# Patient Record
Sex: Female | Born: 1945
Health system: Southern US, Community
[De-identification: ages and names within clinical notes are randomized; demographics above are authoritative.]

---

## 2019-01-26 ENCOUNTER — Inpatient Hospital Stay
Admission: RE | Admit: 2019-01-26 | Discharge: 2019-02-16 | Disposition: A | Discharge status: Hospice | Payer: Medicare Other | Source: Other Acute Inpatient Hospital | Attending: Internal Medicine | Admitting: Internal Medicine

## 2019-01-26 DIAGNOSIS — Z95828 Presence of other vascular implants and grafts: Secondary | ICD-10-CM

## 2019-01-26 DIAGNOSIS — Z931 Gastrostomy status: Secondary | ICD-10-CM

## 2019-01-26 DIAGNOSIS — D72829 Elevated white blood cell count, unspecified: Secondary | ICD-10-CM

## 2019-01-26 DIAGNOSIS — R079 Chest pain, unspecified: Secondary | ICD-10-CM

## 2019-01-27 ENCOUNTER — Other Ambulatory Visit (HOSPITAL_COMMUNITY): Payer: Medicare Other

## 2019-01-27 LAB — CBC WITH DIFFERENTIAL/PLATELET
Abs Immature Granulocytes: 0.66 10*3/uL — ABNORMAL HIGH (ref 0.00–0.07)
Basophils Absolute: 0.1 10*3/uL (ref 0.0–0.1)
Basophils Relative: 1 %
Eosinophils Absolute: 0.3 10*3/uL (ref 0.0–0.5)
Eosinophils Relative: 2 %
HCT: 29.6 % — ABNORMAL LOW (ref 36.0–46.0)
Hemoglobin: 9.5 g/dL — ABNORMAL LOW (ref 12.0–15.0)
Immature Granulocytes: 6 %
Lymphocytes Relative: 6 %
Lymphs Abs: 0.7 10*3/uL (ref 0.7–4.0)
MCH: 32.5 pg (ref 26.0–34.0)
MCHC: 32.1 g/dL (ref 30.0–36.0)
MCV: 101.4 fL — ABNORMAL HIGH (ref 80.0–100.0)
Monocytes Absolute: 1 10*3/uL (ref 0.1–1.0)
Monocytes Relative: 8 %
Neutro Abs: 9.4 10*3/uL — ABNORMAL HIGH (ref 1.7–7.7)
Neutrophils Relative %: 77 %
Platelets: 307 10*3/uL (ref 150–400)
RBC: 2.92 MIL/uL — ABNORMAL LOW (ref 3.87–5.11)
RDW: 20.5 % — ABNORMAL HIGH (ref 11.5–15.5)
WBC: 12.1 10*3/uL — ABNORMAL HIGH (ref 4.0–10.5)
nRBC: 0.6 % — ABNORMAL HIGH (ref 0.0–0.2)

## 2019-01-27 LAB — COMPREHENSIVE METABOLIC PANEL
ALT: 36 U/L (ref 0–44)
AST: 20 U/L (ref 15–41)
Albumin: 1.7 g/dL — ABNORMAL LOW (ref 3.5–5.0)
Alkaline Phosphatase: 102 U/L (ref 38–126)
Anion gap: 12 (ref 5–15)
BUN: 85 mg/dL — ABNORMAL HIGH (ref 8–23)
CO2: 19 mmol/L — ABNORMAL LOW (ref 22–32)
Calcium: 8.9 mg/dL (ref 8.9–10.3)
Chloride: 101 mmol/L (ref 98–111)
Creatinine, Ser: 4.8 mg/dL — ABNORMAL HIGH (ref 0.44–1.00)
GFR calc Af Amer: 10 mL/min — ABNORMAL LOW (ref >60)
GFR calc non Af Amer: 8 mL/min — ABNORMAL LOW (ref >60)
Glucose, Bld: 122 mg/dL — ABNORMAL HIGH (ref 70–99)
Potassium: 4.4 mmol/L (ref 3.5–5.1)
Sodium: 132 mmol/L — ABNORMAL LOW (ref 135–145)
Total Bilirubin: 1.1 mg/dL (ref 0.3–1.2)
Total Protein: 5.3 g/dL — ABNORMAL LOW (ref 6.5–8.1)

## 2019-01-27 LAB — MAGNESIUM: Magnesium: 2.4 mg/dL (ref 1.7–2.4)

## 2019-01-27 LAB — PROTIME-INR
INR: 1.4 — ABNORMAL HIGH (ref 0.8–1.2)
Prothrombin Time: 16.5 seconds — ABNORMAL HIGH (ref 11.4–15.2)

## 2019-01-27 LAB — PHOSPHORUS: Phosphorus: 4.6 mg/dL (ref 2.5–4.6)

## 2019-01-27 LAB — HEMOGLOBIN A1C
Hgb A1c MFr Bld: 4.8 % (ref 4.8–5.6)
Mean Plasma Glucose: 91.06 mg/dL

## 2019-01-28 LAB — COMPREHENSIVE METABOLIC PANEL
ALT: 24 U/L (ref 0–44)
AST: 17 U/L (ref 15–41)
Albumin: 1.8 g/dL — ABNORMAL LOW (ref 3.5–5.0)
Alkaline Phosphatase: 92 U/L (ref 38–126)
Anion gap: 15 (ref 5–15)
BUN: 108 mg/dL — ABNORMAL HIGH (ref 8–23)
CO2: 15 mmol/L — ABNORMAL LOW (ref 22–32)
Calcium: 8.5 mg/dL — ABNORMAL LOW (ref 8.9–10.3)
Chloride: 100 mmol/L (ref 98–111)
Creatinine, Ser: 6.32 mg/dL — ABNORMAL HIGH (ref 0.44–1.00)
GFR calc Af Amer: 7 mL/min — ABNORMAL LOW (ref >60)
GFR calc non Af Amer: 6 mL/min — ABNORMAL LOW (ref >60)
Glucose, Bld: 172 mg/dL — ABNORMAL HIGH (ref 70–99)
Potassium: 4.7 mmol/L (ref 3.5–5.1)
Sodium: 130 mmol/L — ABNORMAL LOW (ref 135–145)
Total Bilirubin: 0.7 mg/dL (ref 0.3–1.2)
Total Protein: 5.3 g/dL — ABNORMAL LOW (ref 6.5–8.1)

## 2019-01-28 LAB — TRIGLYCERIDES: Triglycerides: 71 mg/dL (ref <150)

## 2019-01-28 LAB — PHOSPHORUS: Phosphorus: 6.4 mg/dL — ABNORMAL HIGH (ref 2.5–4.6)

## 2019-01-28 LAB — MAGNESIUM: Magnesium: 2.5 mg/dL — ABNORMAL HIGH (ref 1.7–2.4)

## 2019-01-29 LAB — CBC
HCT: 26.8 % — ABNORMAL LOW (ref 36.0–46.0)
Hemoglobin: 8.8 g/dL — ABNORMAL LOW (ref 12.0–15.0)
MCH: 33.1 pg (ref 26.0–34.0)
MCHC: 32.8 g/dL (ref 30.0–36.0)
MCV: 100.8 fL — ABNORMAL HIGH (ref 80.0–100.0)
Platelets: 315 10*3/uL (ref 150–400)
RBC: 2.66 MIL/uL — ABNORMAL LOW (ref 3.87–5.11)
RDW: 19 % — ABNORMAL HIGH (ref 11.5–15.5)
WBC: 13.2 10*3/uL — ABNORMAL HIGH (ref 4.0–10.5)
nRBC: 0.4 % — ABNORMAL HIGH (ref 0.0–0.2)

## 2019-01-29 LAB — RENAL FUNCTION PANEL
Albumin: 1.7 g/dL — ABNORMAL LOW (ref 3.5–5.0)
Anion gap: 14 (ref 5–15)
BUN: 116 mg/dL — ABNORMAL HIGH (ref 8–23)
CO2: 15 mmol/L — ABNORMAL LOW (ref 22–32)
Calcium: 8.3 mg/dL — ABNORMAL LOW (ref 8.9–10.3)
Chloride: 102 mmol/L (ref 98–111)
Creatinine, Ser: 6.61 mg/dL — ABNORMAL HIGH (ref 0.44–1.00)
GFR calc Af Amer: 7 mL/min — ABNORMAL LOW (ref >60)
GFR calc non Af Amer: 6 mL/min — ABNORMAL LOW (ref >60)
Glucose, Bld: 131 mg/dL — ABNORMAL HIGH (ref 70–99)
Phosphorus: 7 mg/dL — ABNORMAL HIGH (ref 2.5–4.6)
Potassium: 4.7 mmol/L (ref 3.5–5.1)
Sodium: 131 mmol/L — ABNORMAL LOW (ref 135–145)

## 2019-01-29 LAB — MAGNESIUM: Magnesium: 2.5 mg/dL — ABNORMAL HIGH (ref 1.7–2.4)

## 2019-01-29 LAB — LACTIC ACID, PLASMA: Lactic Acid, Venous: 0.7 mmol/L (ref 0.5–1.9)

## 2019-01-29 NOTE — Consult Note (Signed)
[facility] CONSULT NOTE    Date: 01/29/2019                  Patient Name:  Courtney Watts  MRN: [id number]  DOB: Aug 22, 1946  Age / Sex: 73 y.o., female         PCP: No primary care provider on file.                 Service Requesting Consult:  Hospitalist                 Reason for Consult:  Management of end-stage renal disease            History of Present Illness: Patient is a 73 y.o. 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  She was originally admitted to the outside hospital with abdominal pain, distention, nausea, and vomiting.  She was found to have small bowel obstruction.  Surgery was consulted and patient underwent right hemicolectomy with extensive lysis of adhesions and abdominal washout.  Subsequently she had progressive ischemic bowel with necrosis of the right colon and cecum therefore she underwent total small bowel resection and partial transverse colon with low Hartmann pouch and gastrostomy tube diversion.  She was on TPN for significant period of time.  Surgery went on to recommend PEG tube to gravity drainage with lifelong TPN.  Patient also has end-stage renal disease and was maintained on dialysis on Monday, Wednesday, Friday.  We will plan for dialysis session tomorrow.   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  No alcohol or illicit drug use.  Review of Systems: Patient unable to provide this information as she is unable to concentrate on questions posed.  Vital Signs: Temperature 97.3 pulse 68 respirations 13 blood pressure 188/79 Weight trends: There were no vitals filed for this visit.  Physical Exam: General:  Chronically ill-appearing  Head: Normocephalic, atraumatic.  Eyes: Anicteric, EOMI  Nose: Mucous membranes moist, not inflammed, nonerythematous.  Throat: Oropharynx nonerythematous, no exudate appreciated.   Neck: Supple, trachea midline.  Lungs:  Normal respiratory effort. Clear to auscultation BL without crackles or wheezes.  Heart: Irregular  Abdomen:  Surgical wound noted, BS present, G tube in place  Extremities: No pretibial edema.  Neurologic: Lethargic but arousable  Skin: Abdominal surgical scar noted.    Lab results: Basic Metabolic Panel: Recent Labs  Lab 01/27/19 0726 01/28/19 1522 01/29/19 0540  NA 132* 130* 131*  K 4.4 4.7 4.7  CL 101 100 102  CO2 19* 15* 15*  GLUCOSE  122* 172* 131*  BUN 85* 108* 116*  CREATININE 4.80* 6.32* 6.61*  CALCIUM 8.9 8.5* 8.3*  MG 2.4 2.5* 2.5*  PHOS 4.6 6.4* 7.0*    Liver Function Tests: Recent Labs  Lab 01/27/19 0726 01/28/19 1522 01/29/19 0540  AST 20 17  --   ALT 36 24  --   ALKPHOS 102 92  --   BILITOT 1.1 0.7  --   PROT 5.3* 5.3*  --   ALBUMIN 1.7* 1.8* 1.7*   No results  for input(s): LIPASE, AMYLASE in the last 168 hours. No results for input(s): AMMONIA in the last 168 hours.  CBC: Recent Labs  Lab 01/27/19 0726 01/29/19 0540  WBC 12.1* 13.2*  NEUTROABS 9.4*  --   HGB 9.5* 8.8*  HCT 29.6* 26.8*  MCV 101.4* 100.8*  PLT 307 315    Cardiac Enzymes: No results for input(s): CKTOTAL, CKMB, CKMBINDEX, TROPONINI in the last 168 hours.  BNP: Invalid input(s): POCBNP  CBG: No results for input(s): GLUCAP in the last 168 hours.  Microbiology: No results found for this or any previous visit.  Coagulation Studies: Recent Labs    01/27/19 0726  LABPROT 16.5*  INR 1.4*    Urinalysis: No results for input(s): COLORURINE, LABSPEC, PHURINE, GLUCOSEU, HGBUR, BILIRUBINUR, KETONESUR, PROTEINUR, UROBILINOGEN, NITRITE, LEUKOCYTESUR in the last 72 hours.  Invalid input(s): APPERANCEUR    Imaging:  No results found.   Assessment & Plan: Pt is a 73 y.o. 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  She was originally admitted to the outside hospital with abdominal pain, distention, nausea, and vomiting and found to have small bowel obstruction.  Surgery was consulted and patient underwent right hemicolectomy with extensive lysis of adhesions and abdominal washout initially, however ultimatley had to have total small bowel resection and partial transverse colon with low Hartmann pouch and gastrostomy tube diversion.   1.  ESRD on HD MWF.  Patient due for hemodialysis tomorrow.  We will prepare orders.  2.  Anemia of chronic kidney disease.  Hemoglobin noted to be 8.8.  Start the patient on Retacrit 10,000 units IV with dialysis.  3.  Secondary  hyperparathyroidism.  Evaluate PTH, phosphorus, and calcium tomorrow.  4.  Metabolic acidosis.  Serum bicarbonate 15.  This should be corrected with dialysis treatment.  5.  Thanks for consultation.

## 2019-01-30 LAB — RENAL FUNCTION PANEL
Albumin: 1.7 g/dL — ABNORMAL LOW (ref 3.5–5.0)
Anion gap: 15 (ref 5–15)
BUN: 135 mg/dL — ABNORMAL HIGH (ref 8–23)
CO2: 14 mmol/L — ABNORMAL LOW (ref 22–32)
Calcium: 7.9 mg/dL — ABNORMAL LOW (ref 8.9–10.3)
Chloride: 102 mmol/L (ref 98–111)
Creatinine, Ser: 7.54 mg/dL — ABNORMAL HIGH (ref 0.44–1.00)
GFR calc Af Amer: 6 mL/min — ABNORMAL LOW (ref >60)
GFR calc non Af Amer: 5 mL/min — ABNORMAL LOW (ref >60)
Glucose, Bld: 89 mg/dL (ref 70–99)
Phosphorus: 8.4 mg/dL — ABNORMAL HIGH (ref 2.5–4.6)
Potassium: 5.5 mmol/L — ABNORMAL HIGH (ref 3.5–5.1)
Sodium: 131 mmol/L — ABNORMAL LOW (ref 135–145)

## 2019-01-30 LAB — CBC
HCT: 24.7 % — ABNORMAL LOW (ref 36.0–46.0)
Hemoglobin: 8.2 g/dL — ABNORMAL LOW (ref 12.0–15.0)
MCH: 33.2 pg (ref 26.0–34.0)
MCHC: 33.2 g/dL (ref 30.0–36.0)
MCV: 100 fL (ref 80.0–100.0)
Platelets: 268 10*3/uL (ref 150–400)
RBC: 2.47 MIL/uL — ABNORMAL LOW (ref 3.87–5.11)
RDW: 18.6 % — ABNORMAL HIGH (ref 11.5–15.5)
WBC: 13.9 10*3/uL — ABNORMAL HIGH (ref 4.0–10.5)
nRBC: 0.2 % (ref 0.0–0.2)

## 2019-01-30 LAB — MAGNESIUM: Magnesium: 2.7 mg/dL — ABNORMAL HIGH (ref 1.7–2.4)

## 2019-01-30 NOTE — Progress Notes (Signed)
[facility]  ROUNDING NOTE   Subjective:  Patient feeling better today. Completed dialysis today. Resting comfortably.   Objective:  Vital signs in last 24 hours:  Temperature 97.4 pulse 72 respirations 15 blood pressure 135/59  Physical Exam: General: No acute distress  Head: Normocephalic, atraumatic. Moist oral mucosal membranes  Eyes: Anicteric  Neck: Supple, trachea midline  Lungs:  Clear to auscultation, normal effort  Heart: S1S2 no rubs  Abdomen:  Soft, nontender, bowel sounds present, PEG in place  Extremities: Trace peripheral edema.  Neurologic: Awake, alert, following commands  Skin: No lesions  Access: IJ Permcath    Basic Metabolic Panel: Recent Labs  Lab 01/27/19 0726 01/28/19 1522 01/29/19 0540 01/30/19 0500  NA 132* 130* 131* 131*  K 4.4 4.7 4.7 5.5*  CL 101 100 102 102  CO2 19* 15* 15* 14*  GLUCOSE 122* 172* 131* 89  BUN 85* 108* 116* 135*  CREATININE 4.80* 6.32* 6.61* 7.54*  CALCIUM 8.9 8.5* 8.3* 7.9*  MG 2.4 2.5* 2.5* 2.7*  PHOS 4.6 6.4* 7.0* 8.4*    Liver Function Tests: Recent Labs  Lab 01/27/19 0726 01/28/19 1522 01/29/19 0540 01/30/19 0500  AST 20 17  --   --   ALT 36 24  --   --   ALKPHOS 102 92  --   --   BILITOT 1.1 0.7  --   --   PROT 5.3* 5.3*  --   --   ALBUMIN 1.7* 1.8* 1.7* 1.7*   No results for input(s): LIPASE, AMYLASE in the last 168 hours. No results for input(s): AMMONIA in the last 168 hours.  CBC: Recent Labs  Lab 01/27/19 0726 01/29/19 0540 01/30/19 0500  WBC 12.1* 13.2* 13.9*  NEUTROABS 9.4*  --   --   HGB 9.5* 8.8* 8.2*  HCT 29.6* 26.8* 24.7*  MCV 101.4* 100.8* 100.0  PLT 307 315 268    Cardiac Enzymes: No results for input(s): CKTOTAL, CKMB, CKMBINDEX, TROPONINI in the last 168 hours.  BNP: Invalid input(s): POCBNP  CBG: No results for input(s): GLUCAP in the last 168 hours.  Microbiology: No results found for this or any previous visit.  Coagulation Studies: No results  for input(s): LABPROT, INR in the last 72 hours.  Urinalysis: No results for input(s): COLORURINE, LABSPEC, PHURINE, GLUCOSEU, HGBUR, BILIRUBINUR, KETONESUR, PROTEINUR, UROBILINOGEN, NITRITE, LEUKOCYTESUR in the last 72 hours.  Invalid input(s): APPERANCEUR    Imaging: No results found.   Medications:       Assessment/ Plan:  73 y.o. 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  She was originally admitted to the outside hospital with abdominal pain, distention, nausea, and vomiting and found to have small bowel obstruction.  Surgery was consulted and patient underwent right hemicolectomy with extensive lysis of adhesions and abdominal washout initially, however ultimatley had to have total small bowel resection and partial transverse colon with low Hartmann pouch and gastrostomy tube diversion.   1.  ESRD on HD MWF.    Patient completed dialysis today.  Next dialysis treatment to be scheduled for Friday.  2.  Anemia of chronic kidney disease.    Hemoglobin currently 8.2.  Patient to be maintained on reticulocyte rate.  3.  Secondary hyperparathyroidism.    Phosphorus at 8.4.  Repeat this on Friday again.  4.  Metabolic acidosis.  Serum bicarbonate was 14 this a.m. but should be better on Friday as the patient completed dialysis after these labs were performed today.   LOS: 0 Shaquisha Wynn 6/10/20205:32 PM

## 2019-01-31 LAB — HEPATITIS B CORE ANTIBODY, IGM: Hep B C IgM: NEGATIVE

## 2019-01-31 LAB — PTH, INTACT AND CALCIUM
Calcium, Total (PTH): 7.6 mg/dL — ABNORMAL LOW (ref 8.7–10.3)
PTH: 230 pg/mL — ABNORMAL HIGH (ref 15–65)

## 2019-01-31 LAB — HEPATITIS B SURFACE ANTIGEN: Hepatitis B Surface Ag: NEGATIVE

## 2019-01-31 LAB — HEPATITIS B SURFACE ANTIBODY, QUANTITATIVE: Hep B S AB Quant (Post): <3.1 m[IU]/mL — ABNORMAL LOW (ref >9.9)

## 2019-02-01 ENCOUNTER — Other Ambulatory Visit (HOSPITAL_COMMUNITY): Payer: Medicare Other

## 2019-02-01 LAB — RENAL FUNCTION PANEL
Albumin: 1.5 g/dL — ABNORMAL LOW (ref 3.5–5.0)
Anion gap: 11 (ref 5–15)
BUN: 79 mg/dL — ABNORMAL HIGH (ref 8–23)
CO2: 21 mmol/L — ABNORMAL LOW (ref 22–32)
Calcium: 7.6 mg/dL — ABNORMAL LOW (ref 8.9–10.3)
Chloride: 99 mmol/L (ref 98–111)
Creatinine, Ser: 5.2 mg/dL — ABNORMAL HIGH (ref 0.44–1.00)
GFR calc Af Amer: 9 mL/min — ABNORMAL LOW (ref >60)
GFR calc non Af Amer: 8 mL/min — ABNORMAL LOW (ref >60)
Glucose, Bld: 130 mg/dL — ABNORMAL HIGH (ref 70–99)
Phosphorus: 7.3 mg/dL — ABNORMAL HIGH (ref 2.5–4.6)
Potassium: 4 mmol/L (ref 3.5–5.1)
Sodium: 131 mmol/L — ABNORMAL LOW (ref 135–145)

## 2019-02-01 LAB — CBC
HCT: 22.7 % — ABNORMAL LOW (ref 36.0–46.0)
HCT: 25.3 % — ABNORMAL LOW (ref 36.0–46.0)
Hemoglobin: 7.5 g/dL — ABNORMAL LOW (ref 12.0–15.0)
Hemoglobin: 8.5 g/dL — ABNORMAL LOW (ref 12.0–15.0)
MCH: 33 pg (ref 26.0–34.0)
MCH: 32.8 pg (ref 26.0–34.0)
MCHC: 33 g/dL (ref 30.0–36.0)
MCHC: 33.6 g/dL (ref 30.0–36.0)
MCV: 100 fL (ref 80.0–100.0)
MCV: 97.7 fL (ref 80.0–100.0)
Platelets: 254 10*3/uL (ref 150–400)
Platelets: 269 10*3/uL (ref 150–400)
RBC: 2.27 MIL/uL — ABNORMAL LOW (ref 3.87–5.11)
RBC: 2.59 MIL/uL — ABNORMAL LOW (ref 3.87–5.11)
RDW: 18.6 % — ABNORMAL HIGH (ref 11.5–15.5)
RDW: 18.4 % — ABNORMAL HIGH (ref 11.5–15.5)
WBC: 9.7 10*3/uL (ref 4.0–10.5)
WBC: 9.2 10*3/uL (ref 4.0–10.5)
nRBC: 0 % (ref 0.0–0.2)
nRBC: 0 % (ref 0.0–0.2)

## 2019-02-01 LAB — TROPONIN I: Troponin I: <0.03 ng/mL (ref <0.03)

## 2019-02-01 LAB — CK TOTAL AND CKMB (NOT AT ARMC)
CK, MB: 0.8 ng/mL (ref 0.5–5.0)
Relative Index: INVALID (ref 0.0–2.5)
Total CK: 19 U/L — ABNORMAL LOW (ref 38–234)

## 2019-02-01 LAB — MAGNESIUM
Magnesium: 2.2 mg/dL (ref 1.7–2.4)
Magnesium: 1.5 mg/dL — ABNORMAL LOW (ref 1.7–2.4)

## 2019-02-01 NOTE — Progress Notes (Signed)
[facility]  ROUNDING NOTE   Subjective:  Patient seen and evaluated during hemodialysis. Tolerating well. Blood flow rate 350.   Objective:  Vital signs in last 24 hours:  Temperature 97.6 pulse 92 respirations 10 blood pressure 140/70  Physical Exam: General: No acute distress  Head: Normocephalic, atraumatic. Moist oral mucosal membranes  Eyes: Anicteric  Neck: Supple, trachea midline  Lungs:  Clear to auscultation, normal effort  Heart: S1S2 no rubs  Abdomen:  Soft, nontender, bowel sounds present, PEG in place  Extremities: Trace peripheral edema.  Neurologic: Awake, alert, following commands  Skin: No lesions  Access: IJ Permcath    Basic Metabolic Panel: Recent Labs  Lab 01/27/19 0726 01/28/19 1522 01/29/19 0540 01/30/19 0500 02/01/19 0610  NA 132* 130* 131* 131* 131*  K 4.4 4.7 4.7 5.5* 4.0  CL 101 100 102 102 99  CO2 19* 15* 15* 14* 21*  GLUCOSE 122* 172* 131* 89 130*  BUN 85* 108* 116* 135* 79*  CREATININE 4.80* 6.32* 6.61* 7.54* 5.20*  CALCIUM 8.9 8.5* 8.3* 7.9*  7.6* 7.6*  MG 2.4 2.5* 2.5* 2.7* 2.2  PHOS 4.6 6.4* 7.0* 8.4* 7.3*    Liver Function Tests: Recent Labs  Lab 01/27/19 0726 01/28/19 1522 01/29/19 0540 01/30/19 0500 02/01/19 0610  AST 20 17  --   --   --   ALT 36 24  --   --   --   ALKPHOS 102 92  --   --   --   BILITOT 1.1 0.7  --   --   --   PROT 5.3* 5.3*  --   --   --   ALBUMIN 1.7* 1.8* 1.7* 1.7* 1.5*   No results for input(s): LIPASE, AMYLASE in the last 168 hours. No results for input(s): AMMONIA in the last 168 hours.  CBC: Recent Labs  Lab 01/27/19 0726 01/29/19 0540 01/30/19 0500 02/01/19 0610  WBC 12.1* 13.2* 13.9* 9.7  NEUTROABS 9.4*  --   --   --   HGB 9.5* 8.8* 8.2* 7.5*  HCT 29.6* 26.8* 24.7* 22.7*  MCV 101.4* 100.8* 100.0 100.0  PLT 307 315 268 254    Cardiac Enzymes: No results for input(s): CKTOTAL, CKMB, CKMBINDEX, TROPONINI in the last 168 hours.  BNP: Invalid input(s):  POCBNP  CBG: No results for input(s): GLUCAP in the last 168 hours.  Microbiology: No results found for this or any previous visit.  Coagulation Studies: No results for input(s): LABPROT, INR in the last 72 hours.  Urinalysis: No results for input(s): COLORURINE, LABSPEC, PHURINE, GLUCOSEU, HGBUR, BILIRUBINUR, KETONESUR, PROTEINUR, UROBILINOGEN, NITRITE, LEUKOCYTESUR in the last 72 hours.  Invalid input(s): APPERANCEUR    Imaging: No results found.   Medications:       Assessment/ Plan:  73 y.o. 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  She was originally admitted to the outside hospital with abdominal pain, distention, nausea, and vomiting and found to have small bowel obstruction.  Surgery was consulted and patient underwent right hemicolectomy with extensive lysis of adhesions and abdominal washout initially, however ultimatley had to have total small bowel resection and partial transverse colon with low Hartmann pouch and gastrostomy tube diversion.   1.  ESRD on HD MWF.    Patient seen  and evaluated during hemodialysis.  Tolerating well.  We plan to complete dialysis treatment today and next dialysis treatment will be on Monday.  2.  Anemia of chronic kidney disease.    Hemoglobin down to 7.5.  Maintain the patient on Retacrit 10,000 units IV with dialysis.  3.  Secondary hyperparathyroidism.    Phosphorus down a bit to 7.3.  Reevaluate next week.  We may need to consider liquid binder next week.  4.  Metabolic acidosis.  Serum bicarbonate now up to 21 and significantly improved.   LOS: 0 Dorann Davidson 6/12/20205:37 PM

## 2019-02-04 ENCOUNTER — Other Ambulatory Visit (HOSPITAL_COMMUNITY): Payer: Medicare Other

## 2019-02-04 LAB — COMPREHENSIVE METABOLIC PANEL
ALT: 7 U/L (ref 0–44)
AST: 16 U/L (ref 15–41)
Albumin: 1.7 g/dL — ABNORMAL LOW (ref 3.5–5.0)
Alkaline Phosphatase: 127 U/L — ABNORMAL HIGH (ref 38–126)
Anion gap: 11 (ref 5–15)
BUN: 80 mg/dL — ABNORMAL HIGH (ref 8–23)
CO2: 21 mmol/L — ABNORMAL LOW (ref 22–32)
Calcium: 7.8 mg/dL — ABNORMAL LOW (ref 8.9–10.3)
Chloride: 97 mmol/L — ABNORMAL LOW (ref 98–111)
Creatinine, Ser: 5.89 mg/dL — ABNORMAL HIGH (ref 0.44–1.00)
GFR calc Af Amer: 7 mL/min — ABNORMAL LOW (ref >60)
GFR calc non Af Amer: 6 mL/min — ABNORMAL LOW (ref >60)
Glucose, Bld: 129 mg/dL — ABNORMAL HIGH (ref 70–99)
Potassium: 5.1 mmol/L (ref 3.5–5.1)
Sodium: 129 mmol/L — ABNORMAL LOW (ref 135–145)
Total Bilirubin: 1.2 mg/dL (ref 0.3–1.2)
Total Protein: 5.6 g/dL — ABNORMAL LOW (ref 6.5–8.1)

## 2019-02-04 LAB — CBC
HCT: 25 % — ABNORMAL LOW (ref 36.0–46.0)
Hemoglobin: 8.3 g/dL — ABNORMAL LOW (ref 12.0–15.0)
MCH: 32.8 pg (ref 26.0–34.0)
MCHC: 33.2 g/dL (ref 30.0–36.0)
MCV: 98.8 fL (ref 80.0–100.0)
Platelets: 281 10*3/uL (ref 150–400)
RBC: 2.53 MIL/uL — ABNORMAL LOW (ref 3.87–5.11)
RDW: 17.5 % — ABNORMAL HIGH (ref 11.5–15.5)
WBC: 16.3 10*3/uL — ABNORMAL HIGH (ref 4.0–10.5)
nRBC: 0.1 % (ref 0.0–0.2)

## 2019-02-04 LAB — RENAL FUNCTION PANEL
Albumin: 1.6 g/dL — ABNORMAL LOW (ref 3.5–5.0)
Anion gap: 12 (ref 5–15)
BUN: 80 mg/dL — ABNORMAL HIGH (ref 8–23)
CO2: 22 mmol/L (ref 22–32)
Calcium: 7.8 mg/dL — ABNORMAL LOW (ref 8.9–10.3)
Chloride: 95 mmol/L — ABNORMAL LOW (ref 98–111)
Creatinine, Ser: 6.06 mg/dL — ABNORMAL HIGH (ref 0.44–1.00)
GFR calc Af Amer: 7 mL/min — ABNORMAL LOW (ref >60)
GFR calc non Af Amer: 6 mL/min — ABNORMAL LOW (ref >60)
Glucose, Bld: 127 mg/dL — ABNORMAL HIGH (ref 70–99)
Phosphorus: 7.7 mg/dL — ABNORMAL HIGH (ref 2.5–4.6)
Potassium: 5.1 mmol/L (ref 3.5–5.1)
Sodium: 129 mmol/L — ABNORMAL LOW (ref 135–145)

## 2019-02-04 LAB — PHOSPHORUS: Phosphorus: 7.4 mg/dL — ABNORMAL HIGH (ref 2.5–4.6)

## 2019-02-04 LAB — MAGNESIUM: Magnesium: 2.2 mg/dL (ref 1.7–2.4)

## 2019-02-04 LAB — TRIGLYCERIDES: Triglycerides: 97 mg/dL (ref <150)

## 2019-02-04 NOTE — Progress Notes (Signed)
[facility]  ROUNDING NOTE   Subjective:  Patient seen and evaluated at bedside. Due for dialysis today. Case discussed with physical therapy and it appears that pain is limiting her ability to participate with physical therapy..   Objective:  Vital signs in last 24 hours:  Nine 9.8 pulse 84 respiration 17 blood pressure 173/72  Physical Exam: General: No acute distress  Head: Normocephalic, atraumatic. Moist oral mucosal membranes  Eyes: Anicteric  Neck: Supple, trachea midline  Lungs:  Clear to auscultation, normal effort  Heart: S1S2 no rubs  Abdomen:  Soft, nontender, bowel sounds present, PEG in place  Extremities: Trace peripheral edema.  Neurologic: Awake, alert, following commands  Skin: No lesions  Access: IJ Permcath    Basic Metabolic Panel: Recent Labs  Lab 01/28/19 1522 01/29/19 0540 01/30/19 0500 02/01/19 0610 02/01/19 1800 02/04/19 0800  NA 130* 131* 131* 131*  --  129*  K 4.7 4.7 5.5* 4.0  --  5.1  CL 100 102 102 99  --  97*  CO2 15* 15* 14* 21*  --  21*  GLUCOSE 172* 131* 89 130*  --  129*  BUN 108* 116* 135* 79*  --  80*  CREATININE 6.32* 6.61* 7.54* 5.20*  --  5.89*  CALCIUM 8.5* 8.3* 7.9*  7.6* 7.6*  --  7.8*  MG 2.5* 2.5* 2.7* 2.2 1.5* 2.2  PHOS 6.4* 7.0* 8.4* 7.3*  --  7.4*    Liver Function Tests: Recent Labs  Lab 01/28/19 1522 01/29/19 0540 01/30/19 0500 02/01/19 0610 02/04/19 0800  AST 17  --   --   --  16  ALT 24  --   --   --  7  ALKPHOS 92  --   --   --  127*  BILITOT 0.7  --   --   --  1.2  PROT 5.3*  --   --   --  5.6*  ALBUMIN 1.8* 1.7* 1.7* 1.5* 1.7*   No results for input(s): LIPASE, AMYLASE in the last 168 hours. No results for input(s): AMMONIA in the last 168 hours.  CBC: Recent Labs  Lab 01/29/19 0540 01/30/19 0500 02/01/19 0610 02/01/19 1800 02/04/19 0800  WBC 13.2* 13.9* 9.7 9.2 16.3*  HGB 8.8* 8.2* 7.5* 8.5* 8.3*  HCT 26.8* 24.7* 22.7* 25.3* 25.0*  MCV 100.8* 100.0 100.0 97.7 98.8  PLT  315 268 254 269 281    Cardiac Enzymes: Recent Labs  Lab 02/01/19 1800  CKTOTAL 19*  CKMB 0.8  TROPONINI <0.03    BNP: Invalid input(s): POCBNP  CBG: No results for input(s): GLUCAP in the last 168 hours.  Microbiology: No results found for this or any previous visit.  Coagulation Studies: No results for input(s): LABPROT, INR in the last 72 hours.  Urinalysis: No results for input(s): COLORURINE, LABSPEC, PHURINE, GLUCOSEU, HGBUR, BILIRUBINUR, KETONESUR, PROTEINUR, UROBILINOGEN, NITRITE, LEUKOCYTESUR in the last 72 hours.  Invalid input(s): APPERANCEUR    Imaging: No results found.   Medications:       Assessment/ Plan:  73 y.o. 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  She was originally admitted to the outside hospital with abdominal pain, distention, nausea, and vomiting and found to have small bowel obstruction.  Surgery was consulted and patient  underwent right hemicolectomy with extensive lysis of adhesions and abdominal washout initially, however ultimatley had to have total small bowel resection and partial transverse colon with low Hartmann pouch and gastrostomy tube diversion.   1.  ESRD on HD MWF.    Patient due for dialysis today.  Orders have been prepared.  2.  Anemia of chronic kidney disease.    Hemoglobin up to 8.3.  Maintain the patient on Retacrit 10,000 units IV with dialysis.  3.  Secondary hyperparathyroidism.    Phosphorus high at 7.4.  Repeat serum phosphorus on Wednesday.  4.  Metabolic acidosis.  Improved as compared to last week.  Most recent serum bicarbonate was 21.   LOS: 0 Courtney Watts 6/15/20209:02 AM

## 2019-02-05 LAB — CBC
HCT: 23.8 % — ABNORMAL LOW (ref 36.0–46.0)
Hemoglobin: 7.9 g/dL — ABNORMAL LOW (ref 12.0–15.0)
MCH: 32.8 pg (ref 26.0–34.0)
MCHC: 33.2 g/dL (ref 30.0–36.0)
MCV: 98.8 fL (ref 80.0–100.0)
Platelets: 261 10*3/uL (ref 150–400)
RBC: 2.41 MIL/uL — ABNORMAL LOW (ref 3.87–5.11)
RDW: 17.8 % — ABNORMAL HIGH (ref 11.5–15.5)
WBC: 16 10*3/uL — ABNORMAL HIGH (ref 4.0–10.5)
nRBC: 0.3 % — ABNORMAL HIGH (ref 0.0–0.2)

## 2019-02-06 LAB — RENAL FUNCTION PANEL
Albumin: 1.7 g/dL — ABNORMAL LOW (ref 3.5–5.0)
Anion gap: 10 (ref 5–15)
BUN: 62 mg/dL — ABNORMAL HIGH (ref 8–23)
CO2: 23 mmol/L (ref 22–32)
Calcium: 8.1 mg/dL — ABNORMAL LOW (ref 8.9–10.3)
Chloride: 96 mmol/L — ABNORMAL LOW (ref 98–111)
Creatinine, Ser: 4.9 mg/dL — ABNORMAL HIGH (ref 0.44–1.00)
GFR calc Af Amer: 9 mL/min — ABNORMAL LOW (ref >60)
GFR calc non Af Amer: 8 mL/min — ABNORMAL LOW (ref >60)
Glucose, Bld: 150 mg/dL — ABNORMAL HIGH (ref 70–99)
Phosphorus: 6.9 mg/dL — ABNORMAL HIGH (ref 2.5–4.6)
Potassium: 4.9 mmol/L (ref 3.5–5.1)
Sodium: 129 mmol/L — ABNORMAL LOW (ref 135–145)

## 2019-02-06 LAB — CBC
HCT: 23.2 % — ABNORMAL LOW (ref 36.0–46.0)
Hemoglobin: 7.5 g/dL — ABNORMAL LOW (ref 12.0–15.0)
MCH: 32.5 pg (ref 26.0–34.0)
MCHC: 32.3 g/dL (ref 30.0–36.0)
MCV: 100.4 fL — ABNORMAL HIGH (ref 80.0–100.0)
Platelets: 282 10*3/uL (ref 150–400)
RBC: 2.31 MIL/uL — ABNORMAL LOW (ref 3.87–5.11)
RDW: 17.2 % — ABNORMAL HIGH (ref 11.5–15.5)
WBC: 11.4 10*3/uL — ABNORMAL HIGH (ref 4.0–10.5)
nRBC: 0 % (ref 0.0–0.2)

## 2019-02-06 NOTE — Progress Notes (Signed)
[facility]  ROUNDING NOTE   Subjective:  Patient seen and evaluated at bedside. Resting comfortably. Seen and evaluated during dialysis treatment. Appears depressed.  Objective:  Vital signs in last 24 hours:  Temperature 97.5 pulse 83 respiration 17 blood pressure 133/64  Physical Exam: General: No acute distress  Head: Normocephalic, atraumatic. Moist oral mucosal membranes  Eyes: Anicteric  Neck: Supple, trachea midline  Lungs:  Clear to auscultation, normal effort  Heart: S1S2 no rubs  Abdomen:  Soft, nontender, bowel sounds present, PEG in place  Extremities: Trace peripheral edema.  Neurologic: Awake, alert, following commands  Skin: No lesions  Access: IJ Permcath    Basic Metabolic Panel: Recent Labs  Lab 02/01/19 0610 02/01/19 1800 02/04/19 0800 02/06/19 0740  NA 131*  --  129*  129* 129*  K 4.0  --  5.1  5.1 4.9  CL 99  --  95*  97* 96*  CO2 21*  --  22  21* 23  GLUCOSE 130*  --  127*  129* 150*  BUN 79*  --  80*  80* 62*  CREATININE 5.20*  --  6.06*  5.89* 4.90*  CALCIUM 7.6*  --  7.8*  7.8* 8.1*  MG 2.2 1.5* 2.2  --   PHOS 7.3*  --  7.7*  7.4* 6.9*    Liver Function Tests: Recent Labs  Lab 02/01/19 0610 02/04/19 0800 02/06/19 0740  AST  --  16  --   ALT  --  7  --   ALKPHOS  --  127*  --   BILITOT  --  1.2  --   PROT  --  5.6*  --   ALBUMIN 1.5* 1.6*  1.7* 1.7*   No results for input(s): LIPASE, AMYLASE in the last 168 hours. No results for input(s): AMMONIA in the last 168 hours.  CBC: Recent Labs  Lab 02/01/19 0610 02/01/19 1800 02/04/19 0800 02/05/19 1152 02/06/19 0740  WBC 9.7 9.2 16.3* 16.0* 11.4*  HGB 7.5* 8.5* 8.3* 7.9* 7.5*  HCT 22.7* 25.3* 25.0* 23.8* 23.2*  MCV 100.0 97.7 98.8 98.8 100.4*  PLT 254 269 281 261 282    Cardiac Enzymes: Recent Labs  Lab 02/01/19 1800  CKTOTAL 19*  CKMB 0.8  TROPONINI <0.03    BNP: Invalid input(s): POCBNP  CBG: No results for input(s): GLUCAP in the  last 168 hours.  Microbiology: Results for orders placed or performed during the hospital encounter of 01/26/19  Culture, blood (routine x 2)     Status: None (Preliminary result)   Collection Time: 02/04/19  5:56 PM   Specimen: BLOOD  Result Value Ref Range Status   Specimen Description BLOOD LEFT ANTECUBITAL  Final   Special Requests   Final    BOTTLES DRAWN AEROBIC ONLY Blood Culture adequate volume   Culture   Final    NO GROWTH < 24 HOURS Performed at [facility], [place] [address], [place], [place] [address]    Report Status PENDING  Incomplete  Culture, blood (routine x 2)     Status: None (Preliminary result)   Collection Time: 02/04/19  5:56 PM   Specimen: BLOOD LEFT HAND  Result Value Ref Range Status   Specimen Description BLOOD LEFT HAND  Final   Special Requests   Final    BOTTLES DRAWN AEROBIC ONLY Blood Culture adequate volume   Culture   Final    NO GROWTH < 24 HOURS Performed at [facility], [place]  [address] [place], [place] [address]    Report Status PENDING  Incomplete    Coagulation Studies: No results for input(s): LABPROT, INR in the last 72 hours.  Urinalysis: No results for input(s): COLORURINE, LABSPEC, PHURINE, GLUCOSEU, HGBUR, BILIRUBINUR, KETONESUR, PROTEINUR, UROBILINOGEN, NITRITE, LEUKOCYTESUR in the last 72 hours.  Invalid input(s): APPERANCEUR    Imaging: Dg Chest Port 1 View  Result Date: 02/04/2019 CLINICAL DATA:  Elevated white blood cell count EXAM: PORTABLE CHEST 1 VIEW COMPARISON:  02/01/2019 FINDINGS: Cardiomegaly, vascular congestion. Right dialysis catheter and right PICC line remain in place, unchanged. Bibasilar atelectasis or infiltrates. Patchy right perihilar opacities. No visible effusions or acute bony abnormality. IMPRESSION: Cardiomegaly, vascular congestion. Patchy bibasilar atelectasis or infiltrates. Right perihilar atelectasis or infiltrate/pneumonia. Electronically Signed   By: Charlett NoseKevin  Dover M.D.   On:  02/04/2019 19:06     Medications:       Assessment/ Plan:  73 y.o. 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  She was originally admitted to the outside hospital with abdominal pain, distention, nausea, and vomiting and found to have small bowel obstruction.  Surgery was consulted and patient underwent right hemicolectomy with extensive lysis of adhesions and abdominal washout initially, however ultimatley had to have total small bowel resection and partial transverse colon with low Hartmann pouch and gastrostomy tube diversion.   1.  ESRD on HD MWF.    Patient seen and evaluated during hemodialysis.  Tolerating well.  We plan to complete dialysis treatment today.  2.  Anemia of chronic kidney disease.    Hemoglobin currently 7.5.  Maintain the patient on Retacrit 10,000 units IV with dialysis.  3.  Secondary hyperparathyroidism.    Phosphorus down a bit to 6.9.  Continue to monitor.  4.  Metabolic acidosis.  Serum bicarbonate improved to 23.  Continue to monitor bicarbonate.   LOS: 0 Rayon Mcchristian 6/17/202011:05 AM

## 2019-02-08 LAB — RENAL FUNCTION PANEL
Albumin: 1.6 g/dL — ABNORMAL LOW (ref 3.5–5.0)
Anion gap: 10 (ref 5–15)
BUN: 58 mg/dL — ABNORMAL HIGH (ref 8–23)
CO2: 24 mmol/L (ref 22–32)
Calcium: 8.1 mg/dL — ABNORMAL LOW (ref 8.9–10.3)
Chloride: 97 mmol/L — ABNORMAL LOW (ref 98–111)
Creatinine, Ser: 4.69 mg/dL — ABNORMAL HIGH (ref 0.44–1.00)
GFR calc Af Amer: 10 mL/min — ABNORMAL LOW (ref >60)
GFR calc non Af Amer: 9 mL/min — ABNORMAL LOW (ref >60)
Glucose, Bld: 123 mg/dL — ABNORMAL HIGH (ref 70–99)
Phosphorus: 7 mg/dL — ABNORMAL HIGH (ref 2.5–4.6)
Potassium: 4.4 mmol/L (ref 3.5–5.1)
Sodium: 131 mmol/L — ABNORMAL LOW (ref 135–145)

## 2019-02-08 LAB — CBC
HCT: 23.4 % — ABNORMAL LOW (ref 36.0–46.0)
Hemoglobin: 7.5 g/dL — ABNORMAL LOW (ref 12.0–15.0)
MCH: 32.5 pg (ref 26.0–34.0)
MCHC: 32.1 g/dL (ref 30.0–36.0)
MCV: 101.3 fL — ABNORMAL HIGH (ref 80.0–100.0)
Platelets: 313 10*3/uL (ref 150–400)
RBC: 2.31 MIL/uL — ABNORMAL LOW (ref 3.87–5.11)
RDW: 17.1 % — ABNORMAL HIGH (ref 11.5–15.5)
WBC: 10.6 10*3/uL — ABNORMAL HIGH (ref 4.0–10.5)
nRBC: 0.4 % — ABNORMAL HIGH (ref 0.0–0.2)

## 2019-02-08 LAB — MAGNESIUM: Magnesium: 2.2 mg/dL (ref 1.7–2.4)

## 2019-02-08 NOTE — Progress Notes (Signed)
[facility]  ROUNDING NOTE   Subjective:  Patient seen and evaluated during HD.  Tolerating well. BFR 400, UF target 2.6kg.   Objective:  Vital signs in last 24 hours:  Temperature 97.3 pulse 96 respiration 16 blood pressure 121/61  Physical Exam: General: No acute distress  Head: Normocephalic, atraumatic. Moist oral mucosal membranes  Eyes: Anicteric  Neck: Supple, trachea midline  Lungs:  Clear to auscultation, normal effort  Heart: S1S2 no rubs  Abdomen:  Soft, nontender, bowel sounds present, PEG in place  Extremities: Trace peripheral edema.  Neurologic: Awake, alert, following commands  Skin: No lesions  Access: IJ Permcath    Basic Metabolic Panel: Recent Labs  Lab 02/01/19 1800 02/04/19 0800 02/06/19 0740 02/08/19 0722  NA  --  129*  129* 129* 131*  K  --  5.1  5.1 4.9 4.4  CL  --  95*  97* 96* 97*  CO2  --  22  21* 23 24  GLUCOSE  --  127*  129* 150* 123*  BUN  --  80*  80* 62* 58*  CREATININE  --  6.06*  5.89* 4.90* 4.69*  CALCIUM  --  7.8*  7.8* 8.1* 8.1*  MG 1.5* 2.2  --  2.2  PHOS  --  7.7*  7.4* 6.9* 7.0*    Liver Function Tests: Recent Labs  Lab 02/04/19 0800 02/06/19 0740 02/08/19 0722  AST 16  --   --   ALT 7  --   --   ALKPHOS 127*  --   --   BILITOT 1.2  --   --   PROT 5.6*  --   --   ALBUMIN 1.6*  1.7* 1.7* 1.6*   No results for input(s): LIPASE, AMYLASE in the last 168 hours. No results for input(s): AMMONIA in the last 168 hours.  CBC: Recent Labs  Lab 02/01/19 1800 02/04/19 0800 02/05/19 1152 02/06/19 0740 02/08/19 0722  WBC 9.2 16.3* 16.0* 11.4* 10.6*  HGB 8.5* 8.3* 7.9* 7.5* 7.5*  HCT 25.3* 25.0* 23.8* 23.2* 23.4*  MCV 97.7 98.8 98.8 100.4* 101.3*  PLT 269 281 261 282 313    Cardiac Enzymes: Recent Labs  Lab 02/01/19 1800  CKTOTAL 19*  CKMB 0.8  TROPONINI <0.03    BNP: Invalid input(s): POCBNP  CBG: No results for input(s): GLUCAP in the last 168 hours.  Microbiology: Results  for orders placed or performed during the hospital encounter of 01/26/19  Culture, blood (routine x 2)     Status: None (Preliminary result)   Collection Time: 02/04/19  5:56 PM   Specimen: BLOOD  Result Value Ref Range Status   Specimen Description BLOOD LEFT ANTECUBITAL  Final   Special Requests   Final    BOTTLES DRAWN AEROBIC ONLY Blood Culture adequate volume   Culture   Final    NO GROWTH 4 DAYS Performed at [facility], [place] [address] [place], [place] [address]    Report Status PENDING  Incomplete  Culture, blood (routine x 2)     Status: None (Preliminary result)   Collection Time: 02/04/19  5:56 PM   Specimen: BLOOD LEFT HAND  Result Value Ref Range Status   Specimen Description BLOOD LEFT HAND  Final   Special Requests   Final    BOTTLES DRAWN AEROBIC ONLY Blood Culture adequate volume   Culture   Final    NO GROWTH 4 DAYS Performed at [facility], [place],  [place] [id number]    Report Status PENDING  Incomplete    Coagulation Studies: No results for input(s): LABPROT, INR in the last 72 hours.  Urinalysis: No results for input(s): COLORURINE, LABSPEC, PHURINE, GLUCOSEU, HGBUR, BILIRUBINUR, KETONESUR, PROTEINUR, UROBILINOGEN, NITRITE, LEUKOCYTESUR in the last 72 hours.  Invalid input(s): APPERANCEUR    Imaging: No results found.   Medications:       Assessment/ Plan:  73 y.o. 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  She was originally admitted to the outside hospital with abdominal pain, distention, nausea, and vomiting and found to have small bowel obstruction.  Surgery was consulted and patient underwent right hemicolectomy with  extensive lysis of adhesions and abdominal washout initially, however ultimatley had to have total small bowel resection and partial transverse colon with low Hartmann pouch and gastrostomy tube diversion.   1.  ESRD on HD MWF.    Pt seen during dialysis, tolerating well, UF target 2.6kg, next HD on Monday.   2.  Anemia of chronic kidney disease.    Continue retacrit [id number] units IV with HD.   3.  Secondary hyperparathyroidism.   Phos currently 7.0, not taking PO therefore binders will not be effective.  Will monitor.   4.  Metabolic acidosis.  Improved with HD, bicarb now 24.   LOS: 0 Courtney Watts 6/19/20208:37 AM

## 2019-02-09 LAB — CULTURE, BLOOD (ROUTINE X 2)
Culture: NO GROWTH
Culture: NO GROWTH
Special Requests: ADEQUATE
Special Requests: ADEQUATE

## 2019-02-11 LAB — CBC
HCT: 26.1 % — ABNORMAL LOW (ref 36.0–46.0)
Hemoglobin: 8.3 g/dL — ABNORMAL LOW (ref 12.0–15.0)
MCH: 32.4 pg (ref 26.0–34.0)
MCHC: 31.8 g/dL (ref 30.0–36.0)
MCV: 102 fL — ABNORMAL HIGH (ref 80.0–100.0)
Platelets: 406 10*3/uL — ABNORMAL HIGH (ref 150–400)
RBC: 2.56 MIL/uL — ABNORMAL LOW (ref 3.87–5.11)
RDW: 16.7 % — ABNORMAL HIGH (ref 11.5–15.5)
WBC: 13.4 10*3/uL — ABNORMAL HIGH (ref 4.0–10.5)
nRBC: 0.2 % (ref 0.0–0.2)

## 2019-02-11 LAB — COMPREHENSIVE METABOLIC PANEL
ALT: 7 U/L (ref 0–44)
AST: 13 U/L — ABNORMAL LOW (ref 15–41)
Albumin: 1.9 g/dL — ABNORMAL LOW (ref 3.5–5.0)
Alkaline Phosphatase: 170 U/L — ABNORMAL HIGH (ref 38–126)
Anion gap: 12 (ref 5–15)
BUN: 75 mg/dL — ABNORMAL HIGH (ref 8–23)
CO2: 21 mmol/L — ABNORMAL LOW (ref 22–32)
Calcium: 8.3 mg/dL — ABNORMAL LOW (ref 8.9–10.3)
Chloride: 97 mmol/L — ABNORMAL LOW (ref 98–111)
Creatinine, Ser: 5.81 mg/dL — ABNORMAL HIGH (ref 0.44–1.00)
GFR calc Af Amer: 8 mL/min — ABNORMAL LOW (ref >60)
GFR calc non Af Amer: 7 mL/min — ABNORMAL LOW (ref >60)
Glucose, Bld: 152 mg/dL — ABNORMAL HIGH (ref 70–99)
Potassium: 5.2 mmol/L — ABNORMAL HIGH (ref 3.5–5.1)
Sodium: 130 mmol/L — ABNORMAL LOW (ref 135–145)
Total Bilirubin: 1 mg/dL (ref 0.3–1.2)
Total Protein: 6.7 g/dL (ref 6.5–8.1)

## 2019-02-11 LAB — PHOSPHORUS: Phosphorus: 8 mg/dL — ABNORMAL HIGH (ref 2.5–4.6)

## 2019-02-11 LAB — MAGNESIUM: Magnesium: 2.3 mg/dL (ref 1.7–2.4)

## 2019-02-11 LAB — TRIGLYCERIDES: Triglycerides: 95 mg/dL (ref <150)

## 2019-02-11 NOTE — Progress Notes (Signed)
[facility]  ROUNDING NOTE   Subjective:  Patient seen and evaluated during dialysis treatment. Blood flow rate 400. Right internal jugular PermCath functioning well at the moment.  Objective:  Vital signs in last 24 hours:  Temperature 97.3 pulse 61 respirations 12 blood pressure 144/61  Physical Exam: General: No acute distress  Head: Normocephalic, atraumatic. Moist oral mucosal membranes  Eyes: Anicteric  Neck: Supple, trachea midline  Lungs:  Clear to auscultation, normal effort  Heart: S1S2 no rubs  Abdomen:  Soft, nontender, bowel sounds present, PEG in place  Extremities: Trace peripheral edema.  Neurologic: Awake, alert, following commands  Skin: No lesions  Access: IJ Permcath    Basic Metabolic Panel: Recent Labs  Lab 02/06/19 0740 02/08/19 0722 02/11/19 0730  NA 129* 131* 130*  K 4.9 4.4 5.2*  CL 96* 97* 97*  CO2 23 24 21*  GLUCOSE 150* 123* 152*  BUN 62* 58* 75*  CREATININE 4.90* 4.69* 5.81*  CALCIUM 8.1* 8.1* 8.3*  MG  --  2.2 2.3  PHOS 6.9* 7.0* 8.0*    Liver Function Tests: Recent Labs  Lab 02/06/19 0740 02/08/19 0722 02/11/19 0730  AST  --   --  13*  ALT  --   --  7  ALKPHOS  --   --  170*  BILITOT  --   --  1.0  PROT  --   --  6.7  ALBUMIN 1.7* 1.6* 1.9*   No results for input(s): LIPASE, AMYLASE in the last 168 hours. No results for input(s): AMMONIA in the last 168 hours.  CBC: Recent Labs  Lab 02/05/19 1152 02/06/19 0740 02/08/19 0722 02/11/19 0730  WBC 16.0* 11.4* 10.6* 13.4*  HGB 7.9* 7.5* 7.5* 8.3*  HCT 23.8* 23.2* 23.4* 26.1*  MCV 98.8 100.4* 101.3* 102.0*  PLT 261 282 313 406*    Cardiac Enzymes: No results for input(s): CKTOTAL, CKMB, CKMBINDEX, TROPONINI in the last 168 hours.  BNP: Invalid input(s): POCBNP  CBG: No results for input(s): GLUCAP in the last 168 hours.  Microbiology: Results for orders placed or performed during the hospital encounter of 01/26/19  Culture, blood (routine x 2)      Status: None   Collection Time: 02/04/19  5:56 PM   Specimen: BLOOD  Result Value Ref Range Status   Specimen Description BLOOD LEFT ANTECUBITAL  Final   Special Requests   Final    BOTTLES DRAWN AEROBIC ONLY Blood Culture adequate volume   Culture   Final    NO GROWTH 5 DAYS Performed at [facility], [place] [address], [place], [place] [address]    Report Status 02/09/2019 FINAL  Final  Culture, blood (routine x 2)     Status: None   Collection Time: 02/04/19  5:56 PM   Specimen: BLOOD LEFT HAND  Result Value Ref Range Status   Specimen Description BLOOD LEFT HAND  Final   Special Requests   Final    BOTTLES DRAWN AEROBIC ONLY Blood Culture adequate volume   Culture   Final    NO GROWTH 5 DAYS Performed at [facility], [place] [address] [place],  [address]    Report Status 02/09/2019 FINAL  Final    Coagulation Studies: No results for input(s): LABPROT, INR in the last 72 hours.  Urinalysis: No results for input(s): COLORURINE, LABSPEC, PHURINE, GLUCOSEU, HGBUR, BILIRUBINUR, KETONESUR, PROTEINUR, UROBILINOGEN, NITRITE, LEUKOCYTESUR in the last 72 hours.  Invalid input(s): APPERANCEUR    Imaging: No results  found.   Medications:       Assessment/ Plan:  73 y.o. 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  She was originally admitted to the outside hospital with abdominal pain, distention, nausea, and vomiting and found to have small bowel obstruction.  Surgery was consulted and patient underwent right hemicolectomy with extensive lysis of adhesions and abdominal washout initially, however ultimatley had to have total small bowel resection and partial  transverse colon with low Hartmann pouch and gastrostomy tube diversion.   1.  ESRD on HD MWF.    Patient seen and evaluated during dialysis treatment.  Blood flow rate currently 400.  Ultrafiltration target 2 kg.  2.  Anemia of chronic kidney disease.    Hemoglobin currently 8.3.  Continue the patient on Retacrit.  3.  Secondary hyperparathyroidism.    Phosphorus still high at 8.0.  As before phosphorus binders will not help as the patient is on TPN..   4.  Metabolic acidosis.  Over the weekend serum bicarbonate did drop a bit to 21.  This should be corrected with dialysis treatment today.  LOS: 0 Ami Thornsberry 6/22/20208:40 AM

## 2019-02-13 LAB — RENAL FUNCTION PANEL
Albumin: 2.1 g/dL — ABNORMAL LOW (ref 3.5–5.0)
Anion gap: 13 (ref 5–15)
BUN: 57 mg/dL — ABNORMAL HIGH (ref 8–23)
CO2: 24 mmol/L (ref 22–32)
Calcium: 8.6 mg/dL — ABNORMAL LOW (ref 8.9–10.3)
Chloride: 96 mmol/L — ABNORMAL LOW (ref 98–111)
Creatinine, Ser: 5.63 mg/dL — ABNORMAL HIGH (ref 0.44–1.00)
GFR calc Af Amer: 8 mL/min — ABNORMAL LOW (ref >60)
GFR calc non Af Amer: 7 mL/min — ABNORMAL LOW (ref >60)
Glucose, Bld: 83 mg/dL (ref 70–99)
Phosphorus: 7.6 mg/dL — ABNORMAL HIGH (ref 2.5–4.6)
Potassium: 4.9 mmol/L (ref 3.5–5.1)
Sodium: 133 mmol/L — ABNORMAL LOW (ref 135–145)

## 2019-02-13 LAB — CBC
HCT: 26.6 % — ABNORMAL LOW (ref 36.0–46.0)
Hemoglobin: 8.6 g/dL — ABNORMAL LOW (ref 12.0–15.0)
MCH: 32.5 pg (ref 26.0–34.0)
MCHC: 32.3 g/dL (ref 30.0–36.0)
MCV: 100.4 fL — ABNORMAL HIGH (ref 80.0–100.0)
Platelets: 453 10*3/uL — ABNORMAL HIGH (ref 150–400)
RBC: 2.65 MIL/uL — ABNORMAL LOW (ref 3.87–5.11)
RDW: 16.7 % — ABNORMAL HIGH (ref 11.5–15.5)
WBC: 11.2 10*3/uL — ABNORMAL HIGH (ref 4.0–10.5)
nRBC: 0.2 % (ref 0.0–0.2)

## 2019-02-13 NOTE — Progress Notes (Signed)
[facility]  ROUNDING NOTE   Subjective:  Patient seen at bedside. She is sitting up in a chair. Seen and evaluated during dialysis treatment.  Objective:  Vital signs in last 24 hours:  Temperature 97 pulse 120 respirations 18 blood pressure 134/71  Physical Exam: General: No acute distress  Head: Normocephalic, atraumatic. Moist oral mucosal membranes  Eyes: Anicteric  Neck: Supple, trachea midline  Lungs:  Clear to auscultation, normal effort  Heart: S1S2 no rubs  Abdomen:  Soft, nontender, bowel sounds present, PEG in place  Extremities: Trace peripheral edema.  Neurologic: Awake, alert, following commands  Skin: No lesions  Access: IJ Permcath    Basic Metabolic Panel: Recent Labs  Lab 02/08/19 0722 02/11/19 0730 02/13/19 0720  NA 131* 130* 133*  K 4.4 5.2* 4.9  CL 97* 97* 96*  CO2 24 21* 24  GLUCOSE 123* 152* 83  BUN 58* 75* 57*  CREATININE 4.69* 5.81* 5.63*  CALCIUM 8.1* 8.3* 8.6*  MG 2.2 2.3  --   PHOS 7.0* 8.0* 7.6*    Liver Function Tests: Recent Labs  Lab 02/08/19 0722 02/11/19 0730 02/13/19 0720  AST  --  13*  --   ALT  --  7  --   ALKPHOS  --  170*  --   BILITOT  --  1.0  --   PROT  --  6.7  --   ALBUMIN 1.6* 1.9* 2.1*   No results for input(s): LIPASE, AMYLASE in the last 168 hours. No results for input(s): AMMONIA in the last 168 hours.  CBC: Recent Labs  Lab 02/08/19 0722 02/11/19 0730 02/13/19 0720  WBC 10.6* 13.4* 11.2*  HGB 7.5* 8.3* 8.6*  HCT 23.4* 26.1* 26.6*  MCV 101.3* 102.0* 100.4*  PLT 313 406* 453*    Cardiac Enzymes: No results for input(s): CKTOTAL, CKMB, CKMBINDEX, TROPONINI in the last 168 hours.  BNP: Invalid input(s): POCBNP  CBG: No results for input(s): GLUCAP in the last 168 hours.  Microbiology: Results for orders placed or performed during the hospital encounter of 01/26/19  Culture, blood (routine x 2)     Status: None   Collection Time: 02/04/19  5:56 PM   Specimen: BLOOD   Result Value Ref Range Status   Specimen Description BLOOD LEFT ANTECUBITAL  Final   Special Requests   Final    BOTTLES DRAWN AEROBIC ONLY Blood Culture adequate volume   Culture   Final    NO GROWTH 5 DAYS Performed at [facility], [address] [place], [place] [address]    Report Status 02/09/2019 FINAL  Final  Culture, blood (routine x 2)     Status: None   Collection Time: 02/04/19  5:56 PM   Specimen: BLOOD LEFT HAND  Result Value Ref Range Status   Specimen Description BLOOD LEFT HAND  Final   Special Requests   Final    BOTTLES DRAWN AEROBIC ONLY Blood Culture adequate volume   Culture   Final    NO GROWTH 5 DAYS Performed at [facility], [address] [place], [place] [address]    Report Status 02/09/2019 FINAL  Final    Coagulation Studies: No results for input(s): LABPROT, INR in the last 72 hours.  Urinalysis: No results for input(s): COLORURINE, LABSPEC, PHURINE, GLUCOSEU, HGBUR, BILIRUBINUR, KETONESUR, PROTEINUR, UROBILINOGEN, NITRITE, LEUKOCYTESUR in the last 72 hours.  Invalid input(s): APPERANCEUR    Imaging: No results found.   Medications:  Assessment/ Plan:  73 y.o. 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  She was originally admitted to the outside hospital with abdominal pain, distention, nausea, and vomiting and found to have small bowel obstruction.  Surgery was consulted and patient underwent right hemicolectomy with extensive lysis of adhesions and abdominal washout initially, however ultimatley had to have total small bowel resection and partial transverse colon with low Hartmann pouch and gastrostomy tube diversion.   1.   ESRD on HD MWF.    Patient seen during dialysis treatment.  Ultrafiltration target 2 kg today.  2.  Anemia of chronic kidney disease.    Hemoglobin up slightly to 8.6.  We will maintain the patient on Retacrit.  3.  Secondary hyperparathyroidism.    Phosphorus down slightly to 7.6.  Continue to monitor phosphorus trend.  Unable to administer the patient binders as she is on TPN and n.p.o.  4.  Metabolic acidosis.  Serum bicarbonate improved to 24 today.  Continue to monitor.  LOS: 0 Telina Kleckley 6/24/20209:57 AM

## 2019-02-15 ENCOUNTER — Encounter (HOSPITAL_COMMUNITY): Payer: Self-pay | Admitting: Physician Assistant

## 2019-02-15 ENCOUNTER — Other Ambulatory Visit (HOSPITAL_COMMUNITY): Payer: Medicare Other

## 2019-02-15 HISTORY — PX: IR REMOVAL TUN CV CATH W/O FL: IMG2289

## 2019-02-15 LAB — CBC
HCT: 27.1 % — ABNORMAL LOW (ref 36.0–46.0)
Hemoglobin: 8.7 g/dL — ABNORMAL LOW (ref 12.0–15.0)
MCH: 32.2 pg (ref 26.0–34.0)
MCHC: 32.1 g/dL (ref 30.0–36.0)
MCV: 100.4 fL — ABNORMAL HIGH (ref 80.0–100.0)
Platelets: 437 10*3/uL — ABNORMAL HIGH (ref 150–400)
RBC: 2.7 MIL/uL — ABNORMAL LOW (ref 3.87–5.11)
RDW: 16.2 % — ABNORMAL HIGH (ref 11.5–15.5)
WBC: 11.7 10*3/uL — ABNORMAL HIGH (ref 4.0–10.5)
nRBC: 0.5 % — ABNORMAL HIGH (ref 0.0–0.2)

## 2019-02-15 LAB — RENAL FUNCTION PANEL
Albumin: 2.2 g/dL — ABNORMAL LOW (ref 3.5–5.0)
Anion gap: 11 (ref 5–15)
BUN: 52 mg/dL — ABNORMAL HIGH (ref 8–23)
CO2: 25 mmol/L (ref 22–32)
Calcium: 8.6 mg/dL — ABNORMAL LOW (ref 8.9–10.3)
Chloride: 96 mmol/L — ABNORMAL LOW (ref 98–111)
Creatinine, Ser: 5.08 mg/dL — ABNORMAL HIGH (ref 0.44–1.00)
GFR calc Af Amer: 9 mL/min — ABNORMAL LOW (ref >60)
GFR calc non Af Amer: 8 mL/min — ABNORMAL LOW (ref >60)
Glucose, Bld: 170 mg/dL — ABNORMAL HIGH (ref 70–99)
Phosphorus: 7.1 mg/dL — ABNORMAL HIGH (ref 2.5–4.6)
Potassium: 4.5 mmol/L (ref 3.5–5.1)
Sodium: 132 mmol/L — ABNORMAL LOW (ref 135–145)

## 2019-02-15 MED ORDER — LIDOCAINE HCL 1 % IJ SOLN
INTRAMUSCULAR | Status: AC
  Filled 2019-02-15: qty 20

## 2019-02-15 MED ORDER — CHLORHEXIDINE GLUCONATE 4 % EX LIQD
CUTANEOUS | Status: AC
  Filled 2019-02-15: qty 15

## 2019-02-15 MED ORDER — LIDOCAINE HCL 1 % IJ SOLN
INTRAMUSCULAR | Status: AC | PRN
  Administered 2019-02-15: 10 mL

## 2019-02-15 NOTE — Procedures (Signed)
Pre procedural Dx: ESRD Post procedural Dx: Same  Successful removal of tunneled right HD catheter  EBL: None No immediate complications.  Please see imaging section of Epic for full dictation.  Joaquim Nam PA-C 02/15/2019 3:28 PM

## 2019-02-15 NOTE — Progress Notes (Signed)
[facility]  ROUNDING NOTE   Subjective:  Patient seen and evaluated during dialysis treatment. Currently sitting up in chair. It appears patient is considering hospice care.  Objective:  Vital signs in last 24 hours:  Temperature 98 pulse 61 respirations 20 blood pressure 170/52  Physical Exam: General: No acute distress  Head: Normocephalic, atraumatic. Moist oral mucosal membranes  Eyes: Anicteric  Neck: Supple, trachea midline  Lungs:  Clear to auscultation, normal effort  Heart: S1S2 no rubs  Abdomen:  Soft, nontender, bowel sounds present, PEG in place  Extremities: Trace peripheral edema.  Neurologic: Awake, alert, following commands  Skin: No lesions  Access: IJ Permcath    Basic Metabolic Panel: Recent Labs  Lab 02/11/19 0730 02/13/19 0720 02/15/19 0715  NA 130* 133* 132*  K 5.2* 4.9 4.5  CL 97* 96* 96*  CO2 21* 24 25  GLUCOSE 152* 83 170*  BUN 75* 57* 52*  CREATININE 5.81* 5.63* 5.08*  CALCIUM 8.3* 8.6* 8.6*  MG 2.3  --   --   PHOS 8.0* 7.6* 7.1*    Liver Function Tests: Recent Labs  Lab 02/11/19 0730 02/13/19 0720 02/15/19 0715  AST 13*  --   --   ALT 7  --   --   ALKPHOS 170*  --   --   BILITOT 1.0  --   --   PROT 6.7  --   --   ALBUMIN 1.9* 2.1* 2.2*   No results for input(s): LIPASE, AMYLASE in the last 168 hours. No results for input(s): AMMONIA in the last 168 hours.  CBC: Recent Labs  Lab 02/11/19 0730 02/13/19 0720 02/15/19 0715  WBC 13.4* 11.2* 11.7*  HGB 8.3* 8.6* 8.7*  HCT 26.1* 26.6* 27.1*  MCV 102.0* 100.4* 100.4*  PLT 406* 453* 437*    Cardiac Enzymes: No results for input(s): CKTOTAL, CKMB, CKMBINDEX, TROPONINI in the last 168 hours.  BNP: Invalid input(s): POCBNP  CBG: No results for input(s): GLUCAP in the last 168 hours.  Microbiology: Results for orders placed or performed during the hospital encounter of 01/26/19  Culture, blood (routine x 2)     Status: None   Collection Time: 02/04/19   5:56 PM   Specimen: BLOOD  Result Value Ref Range Status   Specimen Description BLOOD LEFT ANTECUBITAL  Final   Special Requests   Final    BOTTLES DRAWN AEROBIC ONLY Blood Culture adequate volume   Culture   Final    NO GROWTH 5 DAYS Performed at [facility], [place] [address] [place], [place] [address]    Report Status 02/09/2019 FINAL  Final  Culture, blood (routine x 2)     Status: None   Collection Time: 02/04/19  5:56 PM   Specimen: BLOOD LEFT HAND  Result Value Ref Range Status   Specimen Description BLOOD LEFT HAND  Final   Special Requests   Final    BOTTLES DRAWN AEROBIC ONLY Blood Culture adequate volume   Culture   Final    NO GROWTH 5 DAYS Performed at [facility], [place] [address] [place], [place] [address]    Report Status 02/09/2019 FINAL  Final    Coagulation Studies: No results for input(s): LABPROT, INR in the last 72 hours.  Urinalysis: No results for input(s): COLORURINE, LABSPEC, PHURINE, GLUCOSEU, HGBUR, BILIRUBINUR, KETONESUR, PROTEINUR, UROBILINOGEN, NITRITE, LEUKOCYTESUR in the last 72 hours.  Invalid input(s): APPERANCEUR    Imaging: No results found.   Medications:  Assessment/ Plan:  73 y.o. 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  She was originally admitted to the outside hospital with abdominal pain, distention, nausea, and vomiting and found to have small bowel obstruction.  Surgery was consulted and patient underwent right hemicolectomy with extensive lysis of adhesions and abdominal washout initially, however ultimatley had to have total small bowel resection and partial transverse colon with low Hartmann pouch and  gastrostomy tube diversion.   1.  ESRD on HD MWF.    Patient seen during dialysis treatment.  Tolerating well.  She is currently considering hospice care.  2.  Anemia of chronic kidney disease.    Hemoglobin up slightly to 8.7.  Maintain the patient on Retacrit.  3.  Secondary hyperparathyroidism.    Phosphorus down slightly to 7.1.  Unable to take binders.  4.  Metabolic acidosis.  Serum bicarbonate at target at 25.  Continue to monitor.  LOS: 0 Brynley Cuddeback 6/26/20208:50 AM

## 2019-03-23 DEATH — deceased

## 2019-09-23 IMAGING — DX PORTABLE CHEST - 1 VIEW
1 series · 1 of 1 positions shown · non-contrast
Comparison: 02/01/2019

CLINICAL DATA: Elevated white blood cell count

EXAM:
PORTABLE CHEST 1 VIEW

[chest ap]
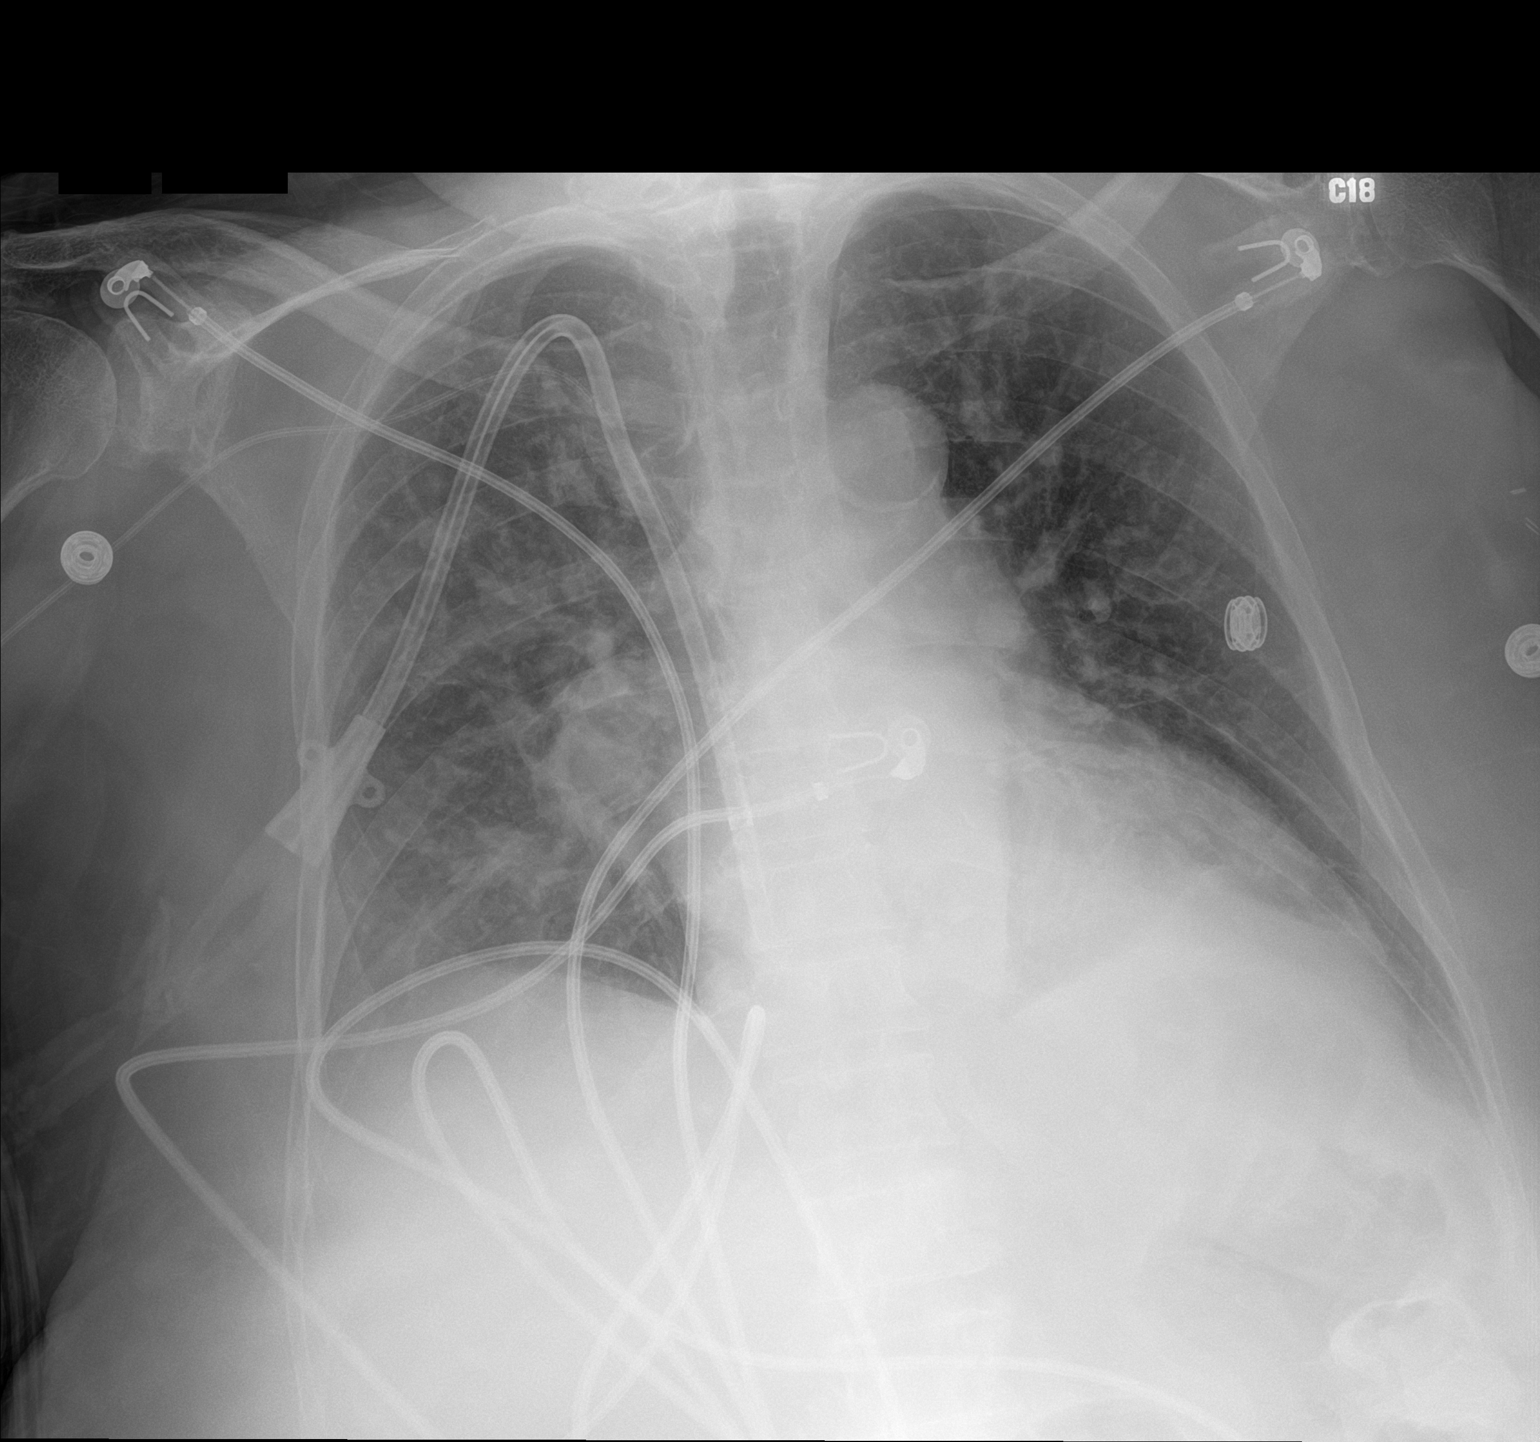

[1 of 1 positions shown; findings below may reference images not displayed]

FINDINGS: Cardiomegaly, vascular congestion. Right dialysis catheter and right
PICC line remain in place, unchanged. Bibasilar atelectasis or
infiltrates. Patchy right perihilar opacities. No visible effusions
or acute bony abnormality.
IMPRESSION: Cardiomegaly, vascular congestion.

Patchy bibasilar atelectasis or infiltrates. Right perihilar
atelectasis or infiltrate/pneumonia.
